# Patient Record
Sex: Male | Born: 2000 | Hispanic: No | Marital: Single | Smoking: Never smoker
Health system: Southern US, Community
[De-identification: ages and names within clinical notes are randomized; demographics above are authoritative.]

## PROBLEM LIST (undated history)

## (undated) DIAGNOSIS — I10 Essential (primary) hypertension: Secondary | ICD-10-CM

## (undated) DIAGNOSIS — R0603 Acute respiratory distress: Secondary | ICD-10-CM

## (undated) HISTORY — DX: Acute respiratory distress: R06.03

## (undated) HISTORY — PX: INGUINAL HERNIA REPAIR: SUR1180

## (undated) HISTORY — DX: Essential (primary) hypertension: I10

---
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
## 2020-06-04 ENCOUNTER — Ambulatory Visit (INDEPENDENT_AMBULATORY_CARE_PROVIDER_SITE_OTHER): Payer: Medicaid Other | Admitting: Student in an Organized Health Care Education/Training Program

## 2020-06-04 ENCOUNTER — Other Ambulatory Visit: Payer: Self-pay

## 2020-06-04 DIAGNOSIS — R040 Epistaxis: Secondary | ICD-10-CM

## 2020-06-04 NOTE — Patient Instructions (Signed)
It was a pleasure to see you today!  To summarize our discussion for this visit:  Your exam today did not show any signs of dangerous causes of your bleeding.  To help prevent frequent bloody noses:  Use a humidifier next to her bed at night, particularly in the winter months when the air is drier  Use nasal saline gel or Vaseline at the opening of your nose to help keep the skin moist  Avoid irritations like allergies which can cause your nose to bleed as well  When you get a bloody nose, squeeze the soft tip part of your nose and lean your face slightly forward to prevent blood from leaking down your throat and cause it to clot sooner  You may see improvement in your bleeding now that the weather is warming up.  If the bloody noses become more of a problem for you, we can talk about cauterizing the vessel  Some additional health maintenance measures we should update are: Health Maintenance Due  Topic Date Due  . Hepatitis C Screening  Never done  . COVID-19 Vaccine (1) Never done  . HIV Screening  Never done  .   Please return to our clinic to see me as needed.  Call the clinic at [phone] if your symptoms worsen or you have any concerns.   Thank you for allowing me to take part in your care,  Dr. Jamelle Rushing   Nosebleed, Adult A nosebleed is when blood comes out of the nose. Nosebleeds are common and can be caused by many things. They are usually not a sign of a serious medical problem. Follow these instructions at home: When you have a nosebleed:  Sit down.  Tilt your head a little forward.  Follow these steps: 1. Pinch your nose with a clean towel or tissue. 2. Keep pinching your nose for 5 minutes. Do not let go. 3. After 5 minutes, let go of your nose. 4. If there is still bleeding, do these steps again. Keep doing these steps until the bleeding stops.  Do not put tissues or other things in your nose to stop the bleeding.  Avoid lying down or  putting your head back.  Use a nose spray decongestant as told by your doctor.   After a nosebleed:  Try not to blow your nose or sniffle for several hours.  Try not to strain, lift, or bend at the waist for several days.  Aspirin and blood-thinning medicines make bleeding more likely. If you take these medicines: ? Ask your doctor if you should stop taking them or if you should change how much you take. ? Do not stop taking the medicine unless your doctor tells you to.  If your nosebleed was caused by dryness, use over-the-counter saline nasal spray or gel and humidifier as told by your doctor. This will keep the inside of your nose moist and allow it to heal. If you need to use one of these products: ? Choose one that is water-soluble. ? Use only as much as you need and use it only as often as needed. ? Do not lie down right away after you use it.  If you get nosebleeds often, talk with your doctor about treatments. These may include: ? Nasal cautery. A chemical swab or electrical device is used to lightly burn tiny blood vessels inside the nose. This helps stop or prevent nosebleeds. ? Nasal packing. A gauze or other material is placed in the nose to keep  constant pressure on the bleeding area. Contact a doctor if:  You have a fever.  You get nosebleeds often.  You are getting nosebleeds more often than usual.  You bruise very easily.  You have something stuck in your nose.  You have bleeding in your mouth.  You vomit or cough up brown material.  You get a nosebleed after you start a new medicine. Get help right away if:  You have a nosebleed after you fall or hurt your head.  Your nosebleed does not go away after 20 minutes.  You feel dizzy or weak.  You have unusual bleeding from other parts of your body.  You have unusual bruising on other parts of your body.  You get sweaty.  You vomit blood. Summary  Nosebleeds are common. They are usually not a sign of  a serious medical problem.  When you have a nosebleed, sit down and tilt your head a little forward. Pinch your nose with a clean tissue for 5 minutes.  Use saline spray or saline gel and a humidifier as told by your doctor.  Get help right away if your nosebleed does not go away after 20 minutes. This information is not intended to replace advice given to you by your health care provider. Make sure you discuss any questions you have with your health care provider. Document Revised: 12/07/2018 Document Reviewed: 12/07/2018 Elsevier Patient Education  2021 [employer].

## 2020-06-04 NOTE — Progress Notes (Signed)
   SUBJECTIVE:   CHIEF COMPLAINT / HPI: nosebleeds  Epistaxis-have occurred frequently throughout his lifetime. Only bleeds in left nostril. Bleeding stops easily with manual pressure of his nose. Has noticed increased frequency in the past few months and noticed that it often worsens that time of year. Has not tried anything to treat it. Denies anemia symptoms. No bleeding elsewhere. No personal or family history of bleeding or clotting disorders.   OBJECTIVE:   BP 126/60   Pulse (!) 58   Ht 5\' 6"  (1.676 m)   Wt 127 lb 12.8 oz (58 kg)   SpO2 100%   BMI 20.63 kg/m   Physical Exam Vitals and nursing note reviewed. Exam conducted with a chaperone present.  Constitutional:      General: He is not in acute distress.    Appearance: He is normal weight.  HENT:     Head: Normocephalic.     Right Ear: External ear normal.     Left Ear: External ear normal.     Nose: Mucosal edema present. No nasal deformity, signs of injury, nasal tenderness or rhinorrhea.     Right Nostril: No epistaxis.     Left Nostril: Epistaxis present.     Right Turbinates: Enlarged.     Comments: Positive for scant bleed and superficial vessel protuberant on left side of septum. Erythematous mucosa on bilateral sides    Mouth/Throat:     Mouth: Mucous membranes are moist.     Pharynx: Oropharynx is clear.  Eyes:     Conjunctiva/sclera: Conjunctivae normal.  Cardiovascular:     Rate and Rhythm: Normal rate and regular rhythm.     Pulses: Normal pulses.  Pulmonary:     Effort: Pulmonary effort is normal.  Skin:    Capillary Refill: Capillary refill takes less than 2 seconds.  Neurological:     General: No focal deficit present.     Mental Status: He is alert and oriented to person, place, and time.    ASSESSMENT/PLAN:   Epistaxis Prominent septal vessel on left and generalized edema/erythema of mucosal tissue in nares. No red flag symptoms.  Will likely improve spontaneously with warmer  weather. Offered multiple treatment solutions and used shared decision-making to come up with plan. -Recommend humidifier near bed at nighttime -Use nasal saline gel spray and/or Vaseline in nostril to keep mucosa moist -Treat allergies with oral Claritin or Allegra as needed -Provided education on proper head positioning and hand positioning to stop the bloody noses if they recur -Recommended if symptoms persist and not improved with these treatments can consider cauterization     , DO [facility]

## 2020-06-05 NOTE — Assessment & Plan Note (Signed)
Prominent septal vessel on left and generalized edema/erythema of mucosal tissue in nares. No red flag symptoms.  Will likely improve spontaneously with warmer weather. Offered multiple treatment solutions and used shared decision-making to come up with plan. -Recommend humidifier near bed at nighttime -Use nasal saline gel spray and/or Vaseline in nostril to keep mucosa moist -Treat allergies with oral Claritin or Allegra as needed -Provided education on proper head positioning and hand positioning to stop the bloody noses if they recur -Recommended if symptoms persist and not improved with these treatments can consider cauterization

## 2020-10-28 ENCOUNTER — Encounter (HOSPITAL_BASED_OUTPATIENT_CLINIC_OR_DEPARTMENT_OTHER): Payer: Self-pay | Admitting: Emergency Medicine

## 2020-10-28 ENCOUNTER — Emergency Department (HOSPITAL_BASED_OUTPATIENT_CLINIC_OR_DEPARTMENT_OTHER): Payer: Medicaid Other

## 2020-10-28 ENCOUNTER — Other Ambulatory Visit: Payer: Self-pay

## 2020-10-28 ENCOUNTER — Emergency Department (HOSPITAL_BASED_OUTPATIENT_CLINIC_OR_DEPARTMENT_OTHER)
Admission: EM | Admit: 2020-10-28 | Discharge: 2020-10-28 | Disposition: A | Discharge status: Home / Self Care | Payer: Medicaid Other | Attending: Emergency Medicine | Admitting: Emergency Medicine

## 2020-10-28 DIAGNOSIS — S3992XA Unspecified injury of lower back, initial encounter: Secondary | ICD-10-CM | POA: Diagnosis present

## 2020-10-28 DIAGNOSIS — S30810A Abrasion of lower back and pelvis, initial encounter: Secondary | ICD-10-CM | POA: Insufficient documentation

## 2020-10-28 DIAGNOSIS — Z79899 Other long term (current) drug therapy: Secondary | ICD-10-CM | POA: Insufficient documentation

## 2020-10-28 DIAGNOSIS — Y9389 Activity, other specified: Secondary | ICD-10-CM | POA: Diagnosis not present

## 2020-10-28 DIAGNOSIS — R072 Precordial pain: Secondary | ICD-10-CM | POA: Insufficient documentation

## 2020-10-28 DIAGNOSIS — R0789 Other chest pain: Secondary | ICD-10-CM | POA: Diagnosis not present

## 2020-10-28 DIAGNOSIS — Z20822 Contact with and (suspected) exposure to covid-19: Secondary | ICD-10-CM | POA: Insufficient documentation

## 2020-10-28 DIAGNOSIS — R079 Chest pain, unspecified: Secondary | ICD-10-CM | POA: Diagnosis not present

## 2020-10-28 DIAGNOSIS — I10 Essential (primary) hypertension: Secondary | ICD-10-CM | POA: Insufficient documentation

## 2020-10-28 DIAGNOSIS — R091 Pleurisy: Secondary | ICD-10-CM | POA: Insufficient documentation

## 2020-10-28 DIAGNOSIS — W268XXA Contact with other sharp object(s), not elsewhere classified, initial encounter: Secondary | ICD-10-CM | POA: Diagnosis not present

## 2020-10-28 LAB — COMPREHENSIVE METABOLIC PANEL
ALT: 25 U/L (ref 0–44)
AST: 24 U/L (ref 15–41)
Albumin: 4.6 g/dL (ref 3.5–5.0)
Alkaline Phosphatase: 50 U/L (ref 38–126)
Anion gap: 10 (ref 5–15)
BUN: 12 mg/dL (ref 6–20)
CO2: 24 mmol/L (ref 22–32)
Calcium: 9.8 mg/dL (ref 8.9–10.3)
Chloride: 104 mmol/L (ref 98–111)
Creatinine, Ser: 0.78 mg/dL (ref 0.61–1.24)
GFR, Estimated: >60 mL/min (ref >60)
Glucose, Bld: 102 mg/dL — ABNORMAL HIGH (ref 70–99)
Potassium: 3.8 mmol/L (ref 3.5–5.1)
Sodium: 138 mmol/L (ref 135–145)
Total Bilirubin: 1.2 mg/dL (ref 0.3–1.2)
Total Protein: 8.3 g/dL — ABNORMAL HIGH (ref 6.5–8.1)

## 2020-10-28 LAB — RESP PANEL BY RT-PCR (FLU A&B, COVID) ARPGX2
Influenza A by PCR: NEGATIVE
Influenza B by PCR: NEGATIVE
SARS Coronavirus 2 by RT PCR: NEGATIVE

## 2020-10-28 LAB — CBC WITH DIFFERENTIAL/PLATELET
Abs Immature Granulocytes: 0.05 10*3/uL (ref 0.00–0.07)
Basophils Absolute: 0 10*3/uL (ref 0.0–0.1)
Basophils Relative: 1 %
Eosinophils Absolute: 0.2 10*3/uL (ref 0.0–0.5)
Eosinophils Relative: 3 %
HCT: 41.6 % (ref 39.0–52.0)
Hemoglobin: 13.8 g/dL (ref 13.0–17.0)
Immature Granulocytes: 1 %
Lymphocytes Relative: 36 %
Lymphs Abs: 3 10*3/uL (ref 0.7–4.0)
MCH: 28.3 pg (ref 26.0–34.0)
MCHC: 33.2 g/dL (ref 30.0–36.0)
MCV: 85.2 fL (ref 80.0–100.0)
Monocytes Absolute: 0.6 10*3/uL (ref 0.1–1.0)
Monocytes Relative: 8 %
Neutro Abs: 4.4 10*3/uL (ref 1.7–7.7)
Neutrophils Relative %: 51 %
Platelets: 378 10*3/uL (ref 150–400)
RBC: 4.88 MIL/uL (ref 4.22–5.81)
RDW: 12.3 % (ref 11.5–15.5)
WBC: 8.4 10*3/uL (ref 4.0–10.5)
nRBC: 0 % (ref 0.0–0.2)

## 2020-10-28 LAB — TROPONIN I (HIGH SENSITIVITY): Troponin I (High Sensitivity): 2 ng/L (ref <18)

## 2020-10-28 MED ORDER — IBUPROFEN 800 MG PO TABS
800.0000 mg | ORAL_TABLET | Freq: Once | ORAL | Status: AC
  Administered 2020-10-28: 800 mg via ORAL
  Filled 2020-10-28: qty 1

## 2020-10-28 NOTE — ED Provider Notes (Signed)
[facility] EMERGENCY DEPT Provider Note   CSN: [id number] Arrival date & time: 10/28/20  1507     History Chief Complaint  Patient presents with   Chest Pain    Douglas [name] is a 20 y.o. male who presents with concern for central Sharp chest pain is some associated heaviness that started around 2/230 this afternoon.  No history of the same.  States onset was while he was sitting still in class, pain is exacerbated by walking, moving, or stretching.  Is not worsened by deep inhalation.  Describes it as constant though minimally improved since onset.  Denies any palpitations or shortness of breath.  Pain was primarily substernal and on the right side according to the patient.  Denies any fevers or chills.  Patient denies any illicit drug use aside from marijuana states he does vape heavily.  Denies any recent new activities or exercises that would have caused trauma or muscular injury.  Patient does also endorse viral URI last week which self resolved.  Additionally patient did have a cupping session done yesterday with superficial cutting with razor blades performed by a family friend according to the patient this is a stress relief.  Does have bruising and superficial abrasions to the back.  Patient is vaccinating is COVID-19.  Patient's mother is at the bedside who endorses that she has a congenital cardiac anomaly, but is unable to tell me which kind.  Also endorses multiple MIs and her brother after the age of 18 as well as in the patient's paternal uncle.  No sudden cardiac death at a young age on either side of the patient's family.  I personally read this patient's medical records.  He has history of transient hypertension and pneumothorax in the past.  He is not on any medications every day.  HPI     Past Medical History:  Diagnosis Date   Hypertension    resolved,    Pneumothorax    Respiratory distress     Patient Active Problem List   Diagnosis Date  Noted   Epistaxis 06/04/2020    Past Surgical History:  Procedure Laterality Date   INGUINAL HERNIA REPAIR  08/29/00       History reviewed. No pertinent family history.  Social History   Tobacco Use   Smoking status: Never   Smokeless tobacco: Never  Vaping Use   Vaping Use: Every day  Substance Use Topics   Alcohol use: No   Drug use: No    Home Medications Prior to Admission medications   Medication Sig Start Date End Date Taking? Authorizing Provider  cetirizine (ZYRTEC) 10 MG tablet Take 1 tablet (10 mg total) by mouth daily. 06/19/18   Leland Her, DO  diclofenac Sodium (VOLTAREN) 1 % GEL Apply 2 g topically 4 (four) times daily. 07/31/19   Joy, Shawn C, PA-C  EPINEPHrine (EPIPEN) 0.3 mg/0.3 mL SOAJ injection Inject 0.3 mLs (0.3 mg total) into the muscle once. 03/06/13   Ozella Rocks, MD  hydrocortisone 1 % ointment Apply 1 application topically 2 (two) times daily. 06/19/18   Leland Her, DO  ibuprofen (ADVIL) 600 MG tablet Take 1 tablet (600 mg total) by mouth every 6 (six) hours as needed. 07/31/19   Joy, Shawn C, PA-C  naproxen (NAPROSYN) 375 MG tablet Take 1 tablet (375 mg total) by mouth 2 (two) times daily with a meal. Patient not taking: Reported on 12/06/2017 03/18/17   Georgetta Haber, NP    Allergies  Bee venom  Review of Systems   Review of Systems  Constitutional: Negative.   HENT: Negative.    Eyes: Negative.   Respiratory:  Positive for chest tightness. Negative for cough, choking and shortness of breath.   Cardiovascular:  Positive for chest pain. Negative for palpitations and leg swelling.  Gastrointestinal: Negative.   Genitourinary: Negative.   Musculoskeletal: Negative.   Skin: Negative.   Neurological: Negative.    Physical Exam Updated Vital Signs BP 133/84   Pulse 67   Temp 99 F (37.2 C) (Oral)   Resp 18   Ht 5\' 6"  (1.676 m)   Wt 63.5 kg   SpO2 98%   BMI 22.60 kg/m   Physical Exam Vitals and nursing note reviewed.   Constitutional:      General: He is not in acute distress.    Appearance: He is normal weight. He is not ill-appearing or toxic-appearing.  HENT:     Head: Normocephalic and atraumatic.     Nose: Nose normal.     Mouth/Throat:     Mouth: Mucous membranes are moist.     Pharynx: Oropharynx is clear. Uvula midline. No oropharyngeal exudate or posterior oropharyngeal erythema.     Tonsils: No tonsillar exudate.  Eyes:     General: Lids are normal. Vision grossly intact.        Right eye: No discharge.        Left eye: No discharge.     Extraocular Movements: Extraocular movements intact.     Conjunctiva/sclera: Conjunctivae normal.     Pupils: Pupils are equal, round, and reactive to light.  Neck:     Trachea: Trachea and phonation normal.  Cardiovascular:     Rate and Rhythm: Normal rate and regular rhythm.     Pulses: Normal pulses.     Heart sounds: Normal heart sounds. No murmur heard. Pulmonary:     Effort: Pulmonary effort is normal. No tachypnea, bradypnea, accessory muscle usage, prolonged expiration or respiratory distress.     Breath sounds: Normal breath sounds. No wheezing or rales.  Chest:     Chest wall: No mass, lacerations, deformity, swelling, tenderness or crepitus.  Abdominal:     General: Bowel sounds are normal. There is no distension.     Palpations: Abdomen is soft.     Tenderness: There is no abdominal tenderness. There is no right CVA tenderness, left CVA tenderness, guarding or rebound.  Musculoskeletal:        General: No deformity.     Cervical back: Normal range of motion and neck supple. No edema, rigidity or crepitus. No pain with movement, spinous process tenderness or muscular tenderness.     Right lower leg: No tenderness. No edema.     Left lower leg: No tenderness. No edema.  Lymphadenopathy:     Cervical: No cervical adenopathy.  Skin:    General: Skin is warm and dry.     Capillary Refill: Capillary refill takes less than 2 seconds.        Neurological:     Mental Status: He is alert. Mental status is at baseline.  Psychiatric:        Mood and Affect: Mood normal.    ED Results / Procedures / Treatments   Labs (all labs ordered are listed, but only abnormal results are displayed) Labs Reviewed  COMPREHENSIVE METABOLIC PANEL - Abnormal; Notable for the following components:      Result Value   Glucose, Bld 102 (*)  Total Protein 8.3 (*)    All other components within normal limits  RESP PANEL BY RT-PCR (FLU A&B, COVID) ARPGX2  CBC WITH DIFFERENTIAL/PLATELET  TROPONIN I (HIGH SENSITIVITY)    EKG EKG Interpretation  Date/Time:  Tuesday October 28 2020 15:15:22 EDT Ventricular Rate:  79 PR Interval:  129 QRS Duration: 100 QT Interval:  354 QTC Calculation: 406 R Axis:   74 Text Interpretation: Sinus arrhythmia normal axis No acute ischemia Confirmed by Pieter Partridge ([id number]) on 10/28/2020 3:19:52 PM  Radiology DG Chest Port 1 View  Result Date: 10/28/2020 CLINICAL DATA:  Chest pain. EXAM: PORTABLE CHEST 1 VIEW COMPARISON:  Chest x-ray 11/19/2004 FINDINGS: The heart size and mediastinal contours are within normal limits. Both lungs are clear. The visualized skeletal structures are unremarkable. IMPRESSION: No active disease. Electronically Signed   By: Darliss Cheney M.D.   On: 10/28/2020 16:01    Procedures Procedures   Medications Ordered in ED Medications  ibuprofen (ADVIL) tablet 800 mg (800 mg Oral Given 10/28/20 1627)    ED Course  I have reviewed the triage vital signs and the nursing notes.  Pertinent labs & imaging results that were available during my care of the patient were reviewed by me and considered in my medical decision making (see chart for details).    MDM Rules/Calculators/A&P                         20 year old male with sudden onset central chest pain exacerbated with movement.  Differential diagnose includes is not limited to pneumothorax, PE, pleural effusion, pneumonia, ACS,  dysrhythmia, pleuritis, musculoskeletal injury, drug effect, pericarditis, dissection, rib fracture.  Patient is PERC negative.   Hypertensive on intake, vital signs otherwise normal.  Patient is borderline febrile and temp of 99.1 F.  Cardiopulmonary exam is normal, abdominal exam is benign.  Skin exam revealed findings as above, consistent with history provided by the patient of cupping session yesterday.  EKG with normal sinus arrhythmia without ischemia.  Chest x-ray negative for acute cardiopulmonary disease.  CBC unremarkable, CMP unremarkable, troponin is negative, 2.  Respiratory pathogen panel negative. Chest x-ray negative for acute cardiopulmonary disease.  An EKG with sinus arrhythmia without dangerous dysrhythmia or ischemia.  Given reassuring blood work, physical exam, and vital signs, no further work-up is warranted in the ED at this time.  Patient reevaluated with significant improvement in his pain after administration of ibuprofen.  Suspect patient's symptoms are secondary to pleurisy given recent viral illness and HPI.  Will provide cardiology follow-up.  No further work-up warranted in the ED at this time this time. Jonael voiced understanding of his medical evaluation and treatment plan.  Each of his questions was answered to his expressed satisfaction.  Strict return precautions are given.  Patient is well-appearing, stable, and appropriate for discharge at this time.  This chart was dictated using voice recognition software, Dragon. Despite the best efforts of this provider to proofread and correct errors, errors may still occur which can change documentation meaning.  Final Clinical Impression(s) / ED Diagnoses Final diagnoses:  Chest pain at rest  Pleurisy    Rx / DC Orders ED Discharge Orders     None        Sherrilee Gilles 10/28/20 1804    Lorre Nick, MD 10/29/20 1248

## 2020-10-28 NOTE — Discharge Instructions (Addendum)
You were seen in the ER today for your chest pain.  Your physical exam, vital signs, blood work, and EKG were very reassuring.  There is no emergent problem with your heart or your lungs.  You have been diagnosed with pleurisy which is inflammation of the lining of your lungs.  This is treated by using anti-inflammatory medication such as ibuprofen.  You may alternate ibuprofen every 3 hours with Tylenol.  This will resolve on its own.  Below is the contact information for the cardiologist, the heart specialist with whom you may follow-up as needed.  Return to the ER with any worsening chest pain, difficulty breathing, nausea or vomiting does not stop, or any other new severe symptom.

## 2020-10-28 NOTE — Progress Notes (Signed)
RT NOTE:  12-lead EKG completed, printed and given to MD.

## 2020-10-28 NOTE — ED Notes (Signed)
X ray in room.

## 2020-10-28 NOTE — ED Triage Notes (Signed)
Pt arrives to ED with c/o substernal chest pain that started an hour ago. Pt was at rest while in class. Walking, moving, and stretching make the pain worse. The pain is sharp and constant. No palpations or SOB. The pain is improving. No fevers or chills. No drugs but vapes daily. Interestingly, pt had "cupping" done yesterday to his back with multiple razor blade cuts to "relieve pain and stress." Pt reports a good amount of bleeding from the cupping areas.

## 2020-10-28 NOTE — ED Notes (Signed)
ED Provider at bedside. 

## 2020-10-29 ENCOUNTER — Telehealth: Payer: Self-pay

## 2020-10-29 NOTE — Telephone Encounter (Signed)
Transition Care Management Unsuccessful Follow-up Telephone Call  Date of discharge and from where:  10/28/2020-[facility]  Attempts:  1st Attempt  Reason for unsuccessful TCM follow-up call:  Left voice message

## 2020-10-30 NOTE — Telephone Encounter (Signed)
Transition Care Management Unsuccessful Follow-up Telephone Call  Date of discharge and from where:  10/28/2020 - [facility]  Attempts:  2nd Attempt  Reason for unsuccessful TCM follow-up call:  Left voice message

## 2020-10-31 NOTE — Telephone Encounter (Signed)
Transition Care Management Unsuccessful Follow-up Telephone Call  Date of discharge and from where:  10/28/2020-[facility]  Attempts:  3rd Attempt  Reason for unsuccessful TCM follow-up call:  Left voice message

## 2021-02-03 ENCOUNTER — Other Ambulatory Visit (HOSPITAL_COMMUNITY)
Admission: RE | Admit: 2021-02-03 | Discharge: 2021-02-03 | Disposition: A | Discharge status: Home / Self Care | Payer: Medicaid Other | Source: Ambulatory Visit | Attending: Family Medicine | Admitting: Family Medicine

## 2021-02-03 ENCOUNTER — Encounter: Payer: Self-pay | Admitting: Family Medicine

## 2021-02-03 ENCOUNTER — Encounter: Payer: Medicaid Other | Admitting: Family Medicine

## 2021-02-03 ENCOUNTER — Other Ambulatory Visit: Payer: Self-pay

## 2021-02-03 ENCOUNTER — Ambulatory Visit (INDEPENDENT_AMBULATORY_CARE_PROVIDER_SITE_OTHER): Payer: Medicaid Other | Admitting: Family Medicine

## 2021-02-03 VITALS — BP 133/70 | HR 70 | Wt 158.4 lb

## 2021-02-03 DIAGNOSIS — R0981 Nasal congestion: Secondary | ICD-10-CM | POA: Diagnosis not present

## 2021-02-03 DIAGNOSIS — Z1159 Encounter for screening for other viral diseases: Secondary | ICD-10-CM | POA: Diagnosis not present

## 2021-02-03 DIAGNOSIS — Z708 Other sex counseling: Secondary | ICD-10-CM | POA: Insufficient documentation

## 2021-02-03 DIAGNOSIS — R030 Elevated blood-pressure reading, without diagnosis of hypertension: Secondary | ICD-10-CM

## 2021-02-03 DIAGNOSIS — Z23 Encounter for immunization: Secondary | ICD-10-CM

## 2021-02-03 DIAGNOSIS — Z0000 Encounter for general adult medical examination without abnormal findings: Secondary | ICD-10-CM

## 2021-02-03 MED ORDER — CETIRIZINE HCL 10 MG PO TABS
10.0000 mg | ORAL_TABLET | Freq: Every day | ORAL | 11 refills | Status: AC
Start: 2021-02-03 — End: ?

## 2021-02-03 NOTE — Patient Instructions (Addendum)
Today at your annual preventive visit we talked about the following measures  Starting flonase with zyrtec for nasal congestion, spraying towards ear to prevent nose bleeds.  I recommend 150 minutes of exercise per week-try 30 minutes 5 days per week We discussed reducing sugary beverages (like soda and juice) and increasing leafy greens and whole fruits.  I recommend avoiding illicit substances.  It was wonderful to see you today.  If you haven't already, sign up for My Chart to have easy access to your labs results, and communication with your primary care physician.  Please call the clinic at [phone] if you have any concerns.  Dr. Salvadore Dom

## 2021-02-03 NOTE — Progress Notes (Signed)
SUBJECTIVE:   Chief compliant/HPI: annual examination  Douglas [name] is a 20 y.o. who presents today for an annual exam.   Concerns:  -Would like to be tested for STIs. He has a new sexual partner that he used condoms with intermittently. He denies any fever, dysuria, penile discharge or lesions.  -Desires the flu shot today.   PMHx: Epistaxis, allergic rhinitis FHx: HTN (Mother, Father), DM (Mother), Denies hx of cancer SurgHx: Inguinal hernia Social Hx: server,  Daily marijuana use  OBJECTIVE:   BP 133/70   Pulse 70   Wt 158 lb 6.4 oz (71.8 kg)   SpO2 100%   BMI 25.57 kg/m   Physical Exam Vitals reviewed.  Constitutional:      General: He is not in acute distress.    Appearance: He is not ill-appearing or toxic-appearing.  HENT:     Head: Normocephalic.     Right Ear: External ear normal.     Left Ear: External ear normal.     Nose: Nose normal.  Eyes:     Extraocular Movements: Extraocular movements intact.     Conjunctiva/sclera: Conjunctivae normal.     Pupils: Pupils are equal, round, and reactive to light.  Cardiovascular:     Rate and Rhythm: Normal rate and regular rhythm.     Heart sounds: Normal heart sounds.  Pulmonary:     Effort: Pulmonary effort is normal.     Breath sounds: Normal breath sounds.  Abdominal:     General: Abdomen is flat. Bowel sounds are normal.     Palpations: Abdomen is soft. There is no mass.     Tenderness: There is no abdominal tenderness. There is no guarding.  Musculoskeletal:     Cervical back: Neck supple.  Lymphadenopathy:     Cervical: No cervical adenopathy.  Neurological:     Mental Status: He is alert and oriented to person, place, and time.     Gait: Gait normal.     Deep Tendon Reflexes: Reflexes normal.  Psychiatric:        Mood and Affect: Mood normal.        Behavior: Behavior normal.   ASSESSMENT/PLAN:   Well adult exam Well appearing 20 yo M without acute concerns today. PMHx significant for  HTN, BP elevated today but would not start antihypertensives at this time. Discussed high BP symptoms and when to return. Advised to quit smoking for overall health and other lifestyle changes that could positively affect his BP.   Nasal congestion, Hx of allergic rhinits Refill. Also discussed flonase OTC and spraying toward ear to prevent nosebleeds. - cetirizine (ZYRTEC) 10 MG tablet; Take 1 tablet (10 mg total) by mouth daily.  Dispense: 30 tablet; Refill: 11   Annual Examination  See AVS for age appropriate recommendations  PHQ score 11, reviewed and discussed. Feeling down due to friend graduating and he still does not know what he wants to do with his life. 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 Discussed therapy options through [email or web address]. No active suicidal thoughts or plans.   Considered the following items based upon USPSTF recommendations: HIV testing: ordered Hepatitis C: ordered Hepatitis B: not indicated Syphilis if at high risk: {ordered GC/CTordered Lipid panel (nonfasting or fasting) discussed based upon AHA recommendations and not ordered.   Reviewed risk factors for latent tuberculosis and not indicated Immunizations- Flu vaccine given.    Follow up in 1 year or sooner if indicated.  Gerlene Fee, [place]

## 2021-02-04 LAB — URINE CYTOLOGY ANCILLARY ONLY
Chlamydia: NEGATIVE
Comment: NEGATIVE
Comment: NEGATIVE
Comment: NORMAL
Neisseria Gonorrhea: NEGATIVE
Trichomonas: NEGATIVE

## 2021-02-04 LAB — HIV ANTIBODY (ROUTINE TESTING W REFLEX): HIV Screen 4th Generation wRfx: NONREACTIVE

## 2021-02-04 LAB — HCV AB W REFLEX TO QUANT PCR: HCV Ab: <0.1 s/co ratio (ref 0.0–0.9)

## 2021-02-04 LAB — RPR: RPR Ser Ql: NONREACTIVE

## 2021-02-04 LAB — HCV INTERPRETATION

## 2021-02-06 ENCOUNTER — Encounter: Payer: Self-pay | Admitting: Family Medicine

## 2021-03-23 IMAGING — CR DG HAND COMPLETE 3+V*R*
3 series · 3 of 3 positions shown · non-contrast
Comparison: None.

CLINICAL DATA: Right hand pain and swelling.

EXAM:
RIGHT HAND - COMPLETE 3+ VIEW

[hand pa]
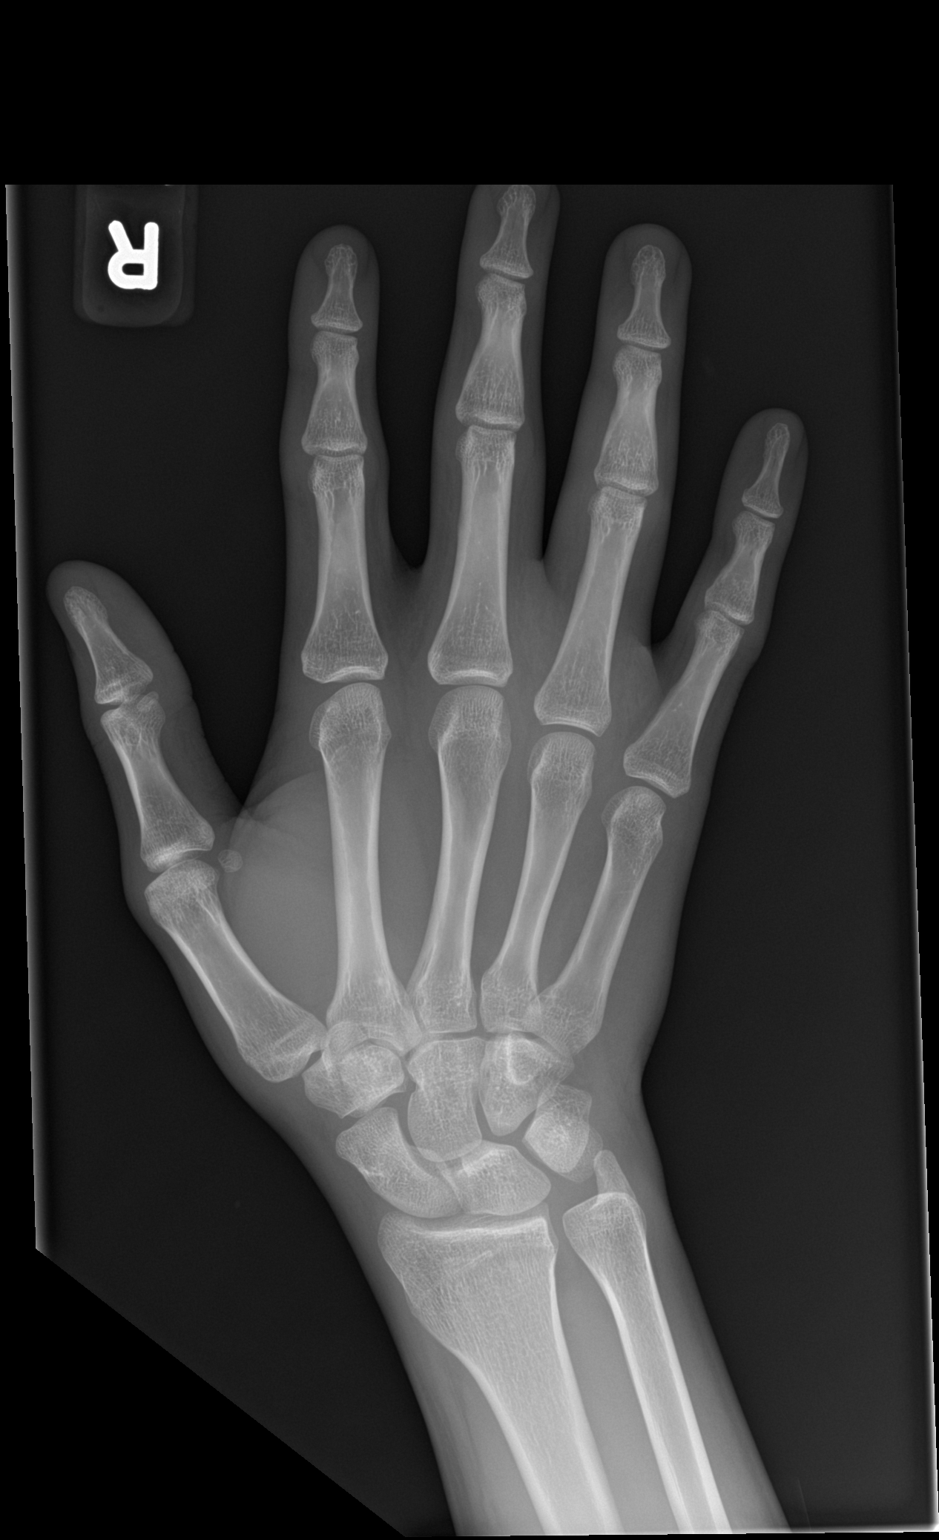

[hand obl]
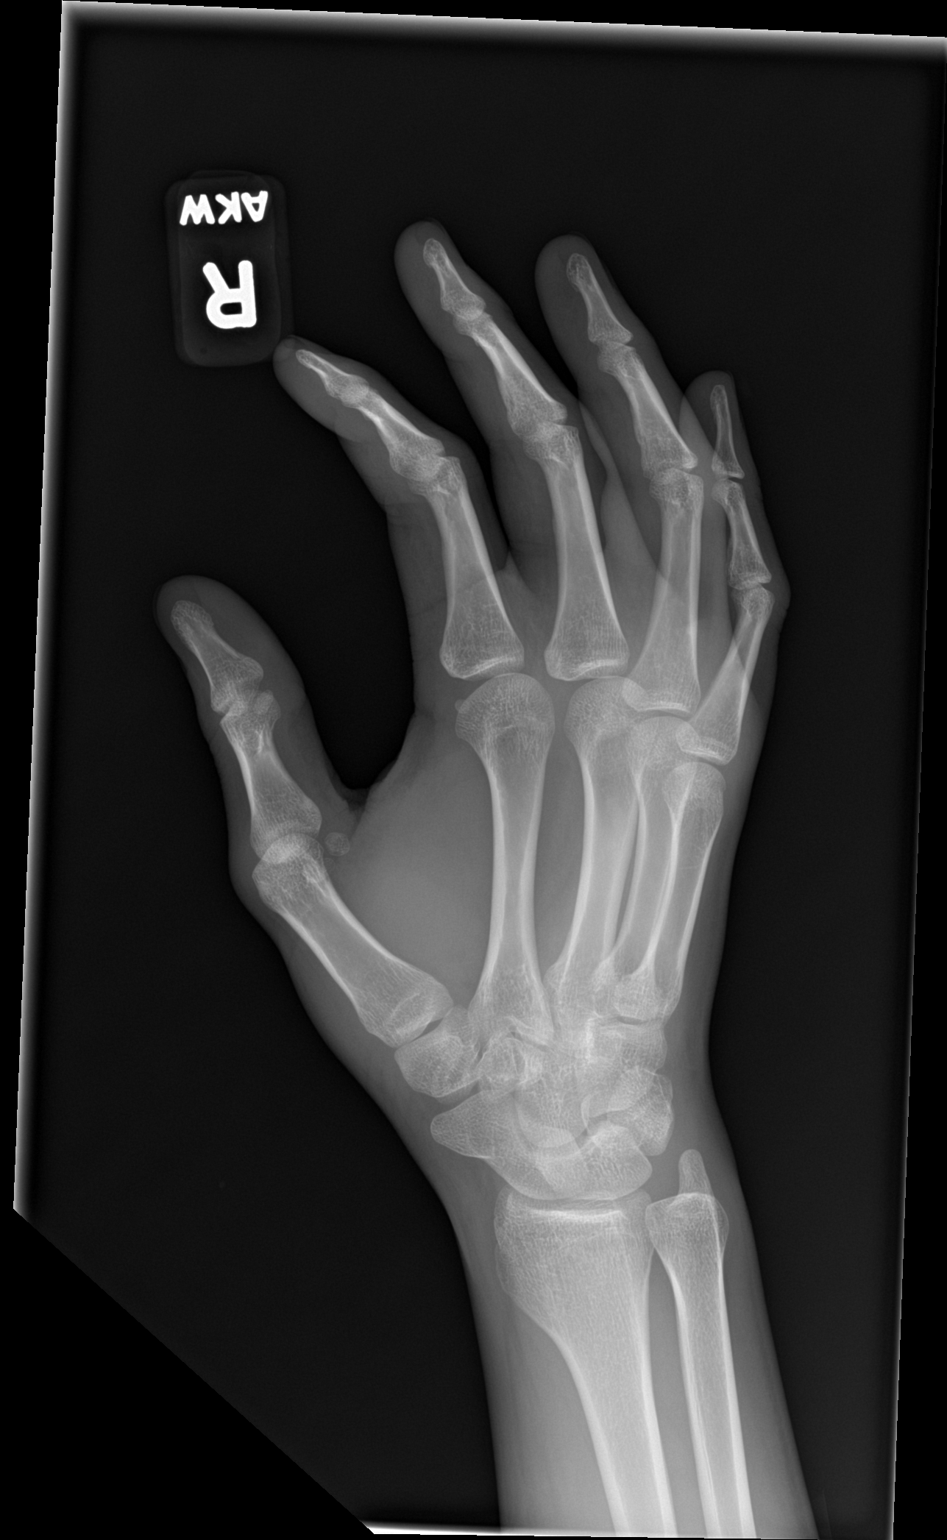

[hand lat]
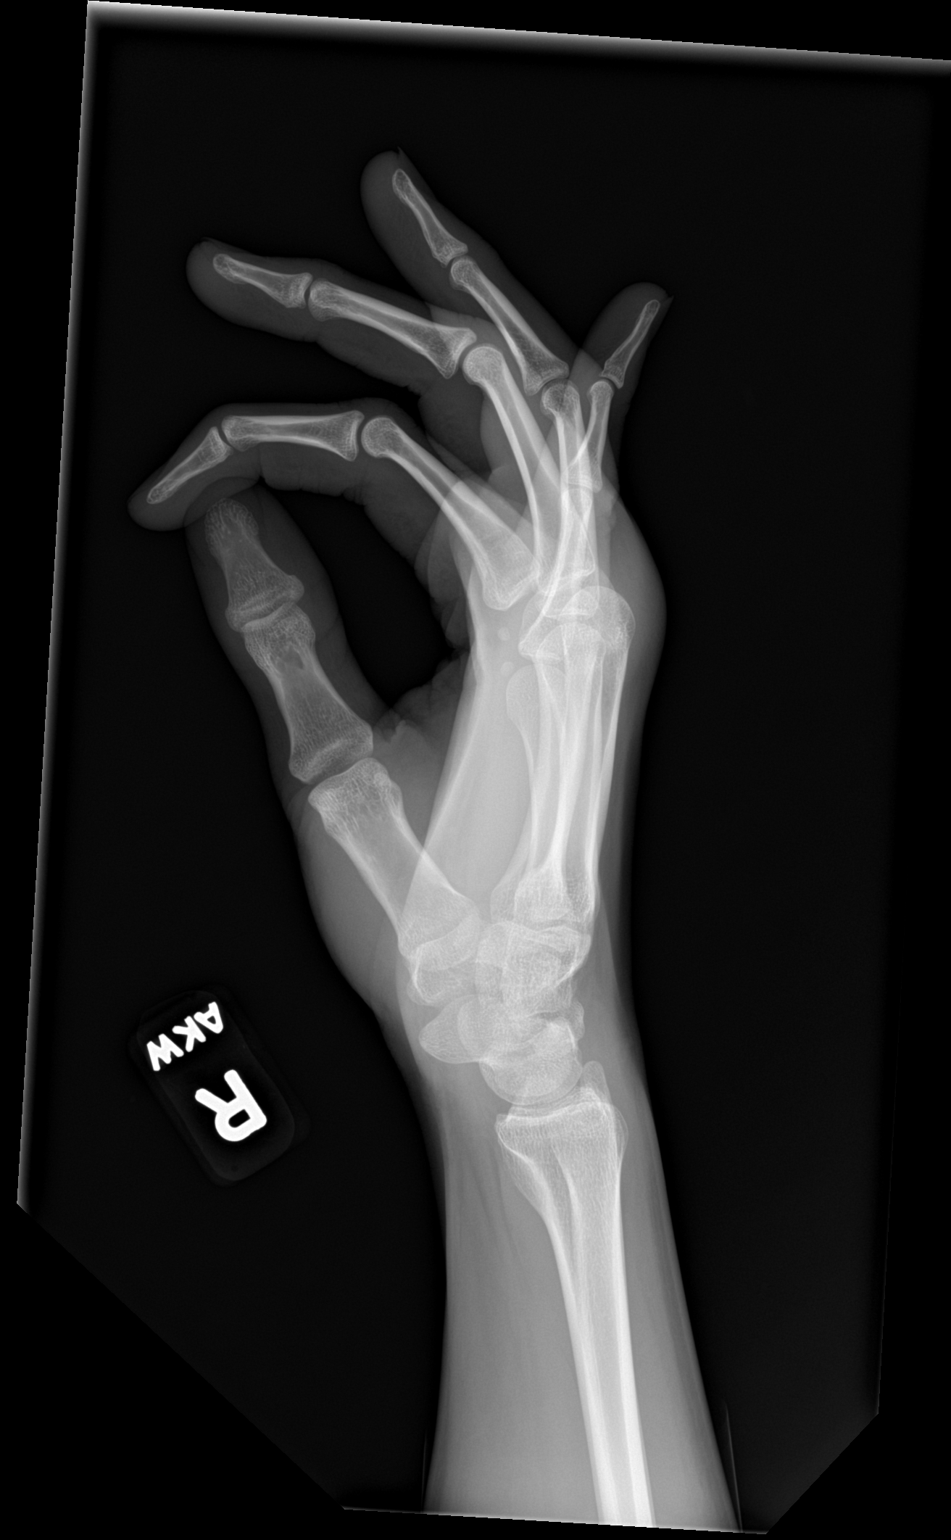

[3 of 3 positions shown; findings below may reference images not displayed]

FINDINGS: There is no evidence of fracture or dislocation. There is no
evidence of arthropathy or other focal bone abnormality. Mild dorsal
soft tissue swelling is seen along the heads of the distal
metacarpals.
IMPRESSION: Mild dorsal soft tissue swelling without evidence of acute osseous
abnormality.

## 2021-07-28 ENCOUNTER — Encounter: Payer: Self-pay | Admitting: *Deleted

## 2021-09-09 ENCOUNTER — Ambulatory Visit: Payer: Medicaid Other | Admitting: Student

## 2021-09-09 VITALS — BP 117/62 | HR 58 | Wt 155.2 lb

## 2021-09-09 DIAGNOSIS — R21 Rash and other nonspecific skin eruption: Secondary | ICD-10-CM | POA: Diagnosis not present

## 2021-09-09 DIAGNOSIS — L700 Acne vulgaris: Secondary | ICD-10-CM | POA: Diagnosis not present

## 2021-09-09 NOTE — Assessment & Plan Note (Signed)
Nondescript barely noticeable rash on dorsal forearms.  Given general skin recommendations of Cetaphil/CeraVe or nonfragranced lotions to use.

## 2021-09-09 NOTE — Patient Instructions (Addendum)
It was great to see you today! Thank you for choosing [facility] for your primary care. Douglas [name] was seen for rash on arms.  Today we addressed: Given the history you presented and limited nature of the rash, I am not greatly concerned.  I know that you mentioned you recently switched from [employer] [facility] [employer] products to something for acne.  Generally, that was probably more beneficial for you as [employer] products tend to irritate the skin.  I would advise using skin products that are dermatologically friendly and nonfragranced.  A couple excellent products are Cetaphil and CeraVe.  You may use the cleanser to wash with and moisturizer after you shower which may help with any sort of dry skin and irritation that you face.  This routine is also excellent to do after you work out and can be beneficial for acne.  Below are also some general recommendations for eczema, although you do not have the typical appearance of eczema.  Eczema can get better or worse depending on the time of year and sometimes without any trigger. The best treatment is prevention.   Prevent eczema flares by:  - Moisturize your skin 1-2 times a day EVERY day with a mild, unscented lotion such as Aveeno, CeraVe, Cetaphil or Eucerin. At night, let the lotion dry and then cover with a barrier ointment such as Vaseline or Aquaphor - In the bath, use a mild, unscented soap such as Dove - When washing clothes, use a fragrance-free laundry detergent  If you haven't already, sign up for My Chart to have easy access to your labs results, and communication with your primary care physician.  You should return to our clinic Return if symptoms worsen or fail to improve.  Please arrive 15 minutes before your appointment to ensure smooth check in process.  We appreciate your efforts in making this happen.  Please call the clinic at [phone] if your symptoms worsen or you have any concerns.  Thank you for allowing  me to participate in your care, Shelby Mattocks, DO 09/09/2021, 10:23 AM PGY-2, [facility]

## 2021-09-09 NOTE — Assessment & Plan Note (Signed)
Advised further discussion regarding acne if current appearance or scarring bothers him.  May use skin recommendations for rash to start with acne treatment.  Advised cleaning after activities or environments that involve extensive sweating.

## 2021-09-09 NOTE — Progress Notes (Signed)
  SUBJECTIVE:   CHIEF COMPLAINT / HPI:   Rash x2 days that was noticed by his girlfriend on his forearms.  Does not greatly bother him, states itching minimally/on noticeably.  Also had a red mark on right upper arm after [place].  He recently switched from using [employer] products to an over-the-counter medication for acne.  PERTINENT  PMH / PSH: None pertinent  OBJECTIVE:  BP 117/62   Pulse (!) 58   Wt 155 lb 3.2 oz (70.4 kg)   SpO2 100%   BMI 25.05 kg/m   General: NAD, pleasant, able to participate in exam Skin: warm and dry, follicularly based papules and pustules with comedones on the face and back with scarring and postinflammatory hyperpigmentation, tanned forearms with <1 mm barely noticeable follicular lesions Psych: Normal affect and mood  ASSESSMENT/PLAN:  Rash and nonspecific skin eruption Nondescript barely noticeable rash on dorsal forearms.  Given general skin recommendations of Cetaphil/CeraVe or nonfragranced lotions to use.   Acne vulgaris Advised further discussion regarding acne if current appearance or scarring bothers him.  May use skin recommendations for rash to start with acne treatment.  Advised cleaning after activities or environments that involve extensive sweating.  Return if symptoms worsen or fail to improve. Shelby Mattocks, DO 09/09/2021, 10:34 AM PGY-2, [facility]

## 2022-03-01 NOTE — Progress Notes (Unsigned)
    SUBJECTIVE:   Chief compliant/HPI: annual examination  Douglas [name] is a 22 y.o. who presents today for an annual exam and to discuss vaping cessation   Vaping use Smokes marijuana and vapes nictotine, interested quitting vaping but not marijuana at this time. Wife is pregnant and he is motivated to stop before baby comes in May.  States he understands that he should not be around his child while smoking marijuana, this may also be something he is interested in quitting in the future but would like to focus on one thing at a time.  Feels like he needs to vape when stressed, in the morning when he wakes up and after smoking marijuana. Also vapes with friends. Has tried stopping cold [place] without nicotine patch or gum in the past which did not work and made him cranky.  Thinks he vapes around 15-20 times per day but difficult to know for sure.  Updated history tabs and problem list.   OBJECTIVE:   Vitals:   03/02/22 1409  BP: 113/60  Pulse: 62  SpO2: 100%   General: Well-appearing 22 year old male, NAD Cardio: RRR, normal S1/S2 Lungs: CTAB, normal effort  ASSESSMENT/PLAN:   Vaping nicotine dependence, non-tobacco product Goal: Try to cut vaping in half.  Vape no more than 10 times per day. Use nicotine gum (prescription sent to pharmacy ) when feeling the urge to vape.  We discussed the importance of chewing the gum very slowly and letting the gum sit in his mouth between chews.    Annual Examination  See AVS for age appropriate recommendations.   PHQ score 0, reviewed and discussed. Blood pressure reviewed and at goal .  Asked about intimate partner violence and patient reports none.    Considered the following items based upon USPSTF recommendations: HIV testing: Negative in 2022 Hepatitis C: Negative in 2022  EpiPen refilled   Douglas [name], [place]

## 2022-03-02 ENCOUNTER — Encounter: Payer: Self-pay | Admitting: Student

## 2022-03-02 ENCOUNTER — Ambulatory Visit (INDEPENDENT_AMBULATORY_CARE_PROVIDER_SITE_OTHER): Payer: Medicaid Other | Admitting: Student

## 2022-03-02 VITALS — BP 113/60 | HR 62 | Ht 66.0 in | Wt 164.0 lb

## 2022-03-02 DIAGNOSIS — R21 Rash and other nonspecific skin eruption: Secondary | ICD-10-CM | POA: Diagnosis not present

## 2022-03-02 DIAGNOSIS — F17200 Nicotine dependence, unspecified, uncomplicated: Secondary | ICD-10-CM | POA: Diagnosis not present

## 2022-03-02 MED ORDER — EPINEPHRINE 0.3 MG/0.3ML IJ SOAJ: 0.3000 mg | Freq: Once | INTRAMUSCULAR | 1 refills | Status: AC

## 2022-03-02 MED ORDER — NICOTINE POLACRILEX 2 MG MT GUM: 2.0000 mg | CHEWING_GUM | OROMUCOSAL | 0 refills | Status: AC | PRN

## 2022-03-02 NOTE — Patient Instructions (Signed)
It was great to see you! Thank you for allowing me to participate in your care!  Our plans for today:  -Quitting vaping is a very difficult thing to do and I commend you for taking the first step!  I have sent a prescription for nicotine gum to your pharmacy.  It is 2 mg, you can take 2 pieces at a time if you feel you need to.  I recommend chewing the gum very very slowly.  -We set a goal today of decreasing vaping in half, going from vaping 20 times a day to 10 times a day and utilizing the nicotine gum in between.  Ask yourself, do I really need to vape right now or can you just chew the gum? -Return in 1 month for follow-up visit  Take care and seek immediate care sooner if you develop any concerns.   Dr. [name], DO [facility]

## 2022-03-02 NOTE — Assessment & Plan Note (Addendum)
Goal: Try to cut vaping in half.  Vape no more than 10 times per day. Use nicotine gum (prescription sent to pharmacy ) when feeling the urge to vape.  We discussed the importance of chewing the gum very slowly and letting the gum sit in his mouth between chews.

## 2022-03-16 ENCOUNTER — Telehealth: Payer: Self-pay

## 2022-03-16 NOTE — Telephone Encounter (Signed)
Patient calls nurse line regarding issues with picking up rx for Nicotine gum. Called pharmacy. They report that as prescription is OTC, his insurance will not cover. They recommend that patient pick this up over the counter.   Called patient and informed.   Talbot Grumbling, RN

## 2022-04-01 NOTE — Progress Notes (Deleted)
    SUBJECTIVE:   CHIEF COMPLAINT / HPI:   Vaping cessation Patient last seen on 03/02/2022 with goal of decreasing vaping by half (no more than 10 times per day) and was provided with prescription of nicotine gum.  Today he states***  PERTINENT  PMH / PSH: ***  OBJECTIVE:   There were no vitals taken for this visit. ***  General: NAD, pleasant, able to participate in exam Cardiac: RRR, no murmurs. Respiratory: CTAB, normal effort, No wheezes, rales or rhonchi Abdomen: Bowel sounds present, nontender, nondistended, no hepatosplenomegaly. Extremities: no edema or cyanosis. Skin: warm and dry, no rashes noted Neuro: alert, no obvious focal deficits Psych: Normal affect and mood  ASSESSMENT/PLAN:   No problem-specific Assessment & Plan notes found for this encounter.     Dr. [name], [place]    {    This will disappear when note is signed, click to select method of visit    :1}

## 2022-04-02 ENCOUNTER — Ambulatory Visit: Payer: Self-pay | Admitting: Student

## 2023-08-02 ENCOUNTER — Encounter: Payer: Self-pay | Admitting: *Deleted
